# Patient Record
Sex: Female | Born: 1975 | Race: Black or African American | Hispanic: No | Marital: Married | Smoking: Never smoker
Health system: Southern US, Community
[De-identification: ages and names within clinical notes are randomized; demographics above are authoritative.]

---

## 2010-04-29 ENCOUNTER — Emergency Department (HOSPITAL_BASED_OUTPATIENT_CLINIC_OR_DEPARTMENT_OTHER): Admission: EM | Admit: 2010-04-29 | Discharge: 2010-04-29 | Payer: Self-pay | Admitting: Emergency Medicine

## 2014-02-01 ENCOUNTER — Encounter (HOSPITAL_BASED_OUTPATIENT_CLINIC_OR_DEPARTMENT_OTHER): Payer: Self-pay | Admitting: Emergency Medicine

## 2014-02-01 ENCOUNTER — Emergency Department (HOSPITAL_BASED_OUTPATIENT_CLINIC_OR_DEPARTMENT_OTHER)
Admission: EM | Admit: 2014-02-01 | Discharge: 2014-02-01 | Disposition: A | Discharge status: Home / Self Care | Payer: Self-pay | Attending: Emergency Medicine | Admitting: Emergency Medicine

## 2014-02-01 ENCOUNTER — Emergency Department (HOSPITAL_BASED_OUTPATIENT_CLINIC_OR_DEPARTMENT_OTHER): Payer: Self-pay

## 2014-02-01 DIAGNOSIS — R209 Unspecified disturbances of skin sensation: Secondary | ICD-10-CM | POA: Insufficient documentation

## 2014-02-01 DIAGNOSIS — M436 Torticollis: Secondary | ICD-10-CM | POA: Insufficient documentation

## 2014-02-01 MED ORDER — METAXALONE 800 MG PO TABS: 800.0000 mg | ORAL_TABLET | Freq: Three times a day (TID) | ORAL | Status: DC

## 2014-02-01 MED ORDER — NAPROXEN 500 MG PO TABS: 500.0000 mg | ORAL_TABLET | Freq: Two times a day (BID) | ORAL | Status: DC

## 2014-02-01 MED ORDER — HYDROCODONE-ACETAMINOPHEN 5-325 MG PO TABS: 1.0000 | ORAL_TABLET | ORAL | Status: DC | PRN

## 2014-02-01 NOTE — ED Notes (Addendum)
C/o right side neck "spasms" x 1 week

## 2014-02-01 NOTE — Discharge Instructions (Signed)
Torticollis, Acute °You have suddenly (acutely) developed a twisted neck (torticollis). This is usually a self-limited condition. °CAUSES  °Acute torticollis may be caused by malposition, trauma or infection. Most commonly, acute torticollis is caused by sleeping in an awkward position. Torticollis may also be caused by the flexion, extension or twisting of the neck muscles beyond their normal position. Sometimes, the exact cause may not be known. °SYMPTOMS  °Usually, there is pain and limited movement of the neck. Your neck may twist to one side. °DIAGNOSIS  °The diagnosis is often made by physical examination. X-rays, CT scans or MRIs may be done if there is a history of trauma or concern of infection. °TREATMENT  °For a common, stiff neck that develops during sleep, treatment is focused on relaxing the contracted neck muscle. Medications (including shots) may be used to treat the problem. Most cases resolve in several days. Torticollis usually responds to conservative physical therapy. If left untreated, the shortened and spastic neck muscle can cause deformities in the face and neck. Rarely, surgery is required. °HOME CARE INSTRUCTIONS  °· Use over-the-counter and prescription medications as directed by your caregiver. °· Do stretching exercises and massage the neck as directed by your caregiver. °· Follow up with physical therapy if needed and as directed by your caregiver. °SEEK IMMEDIATE MEDICAL CARE IF:  °· You develop difficulty breathing or noisy breathing (stridor). °· You drool, develop trouble swallowing or have pain with swallowing. °· You develop numbness or weakness in the hands or feet. °· You have changes in speech or vision. °· You have problems with urination or bowel movements. °· You have difficulty walking. °· You have a fever. °· You have increased pain. °MAKE SURE YOU:  °· Understand these instructions. °· Will watch your condition. °· Will get help right away if you are not doing well or  get worse. °Document Released: 09/12/2000 Document Revised: 12/08/2011 Document Reviewed: 10/24/2009 °[employer]® Patient Information ©2014 [employer]. ° °

## 2014-02-01 NOTE — ED Provider Notes (Signed)
CSN: [id number]     Arrival date & time 02/01/14  1628 History   First MD Initiated Contact with Patient 02/01/14 1641     Chief Complaint  Patient presents with  . Neck Pain    Patient is a 38 y.o. female presenting with neck pain.  Neck Pain Pain location:  R side Quality:  Aching Pain radiates to:  R shoulder Pain severity:  Moderate Onset quality:  Gradual Timing:  Constant Context: not fall, not MCA, not MVA and not recent injury   Relieved by:  Nothing Worsened by:  Position (movement and palpation) Ineffective treatments:  Analgesics Associated symptoms: no fever   Associated symptoms comment:  Her right arm feels numb, the entire right arm (glove pattern) Risk factors: no hx of head and neck radiation, no hx of spinal trauma and no recent head injury     History reviewed. No pertinent past medical history. History reviewed. No pertinent past surgical history. No family history on file. History  Substance Use Topics  . Smoking status: Never Smoker   . Smokeless tobacco: Not on file  . Alcohol Use: No   OB History   Grav Para Term Preterm Abortions TAB SAB Ect Mult Living                 Review of Systems  Constitutional: Negative for fever.  Musculoskeletal: Positive for neck pain.  All other systems reviewed and are negative.     Allergies  Review of patient's allergies indicates no known allergies.  Home Medications   Prior to Admission medications   Not on File   BP 132/81  Pulse 106  Temp(Src) 99 F (37.2 C) (Oral)  Resp 20  Ht 5\' 3"  (1.6 m)  Wt 185 lb (83.915 kg)  BMI 32.78 kg/m2  SpO2 100%  LMP 01/12/2014 Physical Exam  Nursing note and vitals reviewed. Constitutional: She appears well-developed and well-nourished. No distress.  HENT:  Head: Normocephalic and atraumatic.  Right Ear: External ear normal.  Left Ear: External ear normal.  Eyes: Conjunctivae are normal. Right eye exhibits no discharge. Left eye exhibits no discharge. No  scleral icterus.  Neck: Neck supple. Muscular tenderness present. Decreased range of motion present. No tracheal deviation present. No mass and no thyromegaly present.    Cardiovascular: Normal rate.   Pulmonary/Chest: Effort normal. No stridor. No respiratory distress.  Musculoskeletal: She exhibits no edema.  Neurological: She is alert. Cranial nerve deficit: no gross deficits.  Equal grip strength, sensation intact but subjective altered sensation right side.  Skin: Skin is warm and dry. No rash noted.  Psychiatric: She has a normal mood and affect.    ED Course  Procedures (including critical care time) Labs Review Labs Reviewed - No data to display  Imaging Review Dg Cervical Spine Complete  02/01/2014   CLINICAL DATA:  Neck pain and spasms.  EXAM: CERVICAL SPINE  4+ VIEWS  COMPARISON:  None.  FINDINGS: The cervical spine is visualized from the occiput to the cervicothoracic junction. There is straightening of the normal cervical lordosis without subluxation or fracture. Alignment is otherwise anatomic. Vertebral body and disc space heights are maintained. No degenerative changes. Prevertebral soft tissues are within normal limits. Neural foramina are patent. Dens is obscured on dedicated views. Visualized lung apices are clear.  IMPRESSION: Straightening of the normal cervical lordosis. No additional findings to explain the patient's pain.   Electronically Signed   By: Leanna BattlesMelinda  Blietz M.D.   On: 02/01/2014 17:13  MDM   Final diagnoses:  Torticollis    May have a component of nerve impingement but pt has good strength and the non dermatomal pattern argues against hnp.  Will dc home with pain meds, muscle relaxant.  Discussed importance of follow up   Celene KrasJon R Peace Jost, MD 02/01/14 1815

## 2015-02-27 ENCOUNTER — Telehealth: Payer: Self-pay | Admitting: Family

## 2015-02-27 ENCOUNTER — Ambulatory Visit (INDEPENDENT_AMBULATORY_CARE_PROVIDER_SITE_OTHER)
Admission: RE | Admit: 2015-02-27 | Discharge: 2015-02-27 | Disposition: A | Discharge status: Home / Self Care | Payer: No Typology Code available for payment source | Source: Ambulatory Visit | Attending: Family | Admitting: Family

## 2015-02-27 ENCOUNTER — Encounter: Payer: Self-pay | Admitting: Family

## 2015-02-27 ENCOUNTER — Ambulatory Visit (INDEPENDENT_AMBULATORY_CARE_PROVIDER_SITE_OTHER): Payer: BLUE CROSS/BLUE SHIELD | Admitting: Family

## 2015-02-27 VITALS — BP 104/72 | HR 89 | Temp 98.3°F | Resp 18 | Ht 65.0 in | Wt 198.0 lb

## 2015-02-27 DIAGNOSIS — R0789 Other chest pain: Secondary | ICD-10-CM | POA: Diagnosis not present

## 2015-02-27 MED ORDER — NAPROXEN 500 MG PO TABS: 500.0000 mg | ORAL_TABLET | Freq: Two times a day (BID) | ORAL | Status: AC

## 2015-02-27 MED ORDER — PREDNISONE 5 MG (21) PO TBPK: ORAL_TABLET | ORAL | Status: AC

## 2015-02-27 MED ORDER — NAPROXEN 500 MG PO TABS: 500.0000 mg | ORAL_TABLET | Freq: Two times a day (BID) | ORAL | Status: DC

## 2015-02-27 NOTE — Progress Notes (Signed)
Subjective:    Patient ID: Teresa Proctor, female    DOB: 07/18/1976, 39 y.o.   MRN: [id number]  Chief Complaint  Patient presents with  . Establish Care    has been having a pain in her chest, feels like she strained a muscle, hurts to the touch, every way she lays it hurts, x2 months off and on went to ER in the past with the same feeling and they said it was a pulled muscle    HPI:  Teresa Proctor is a 39 y.o. female with a PMH of no significant history who presents today for an office visit to establish care.   1.) Chest pain - Associated symptom of pain located in her chest that is described as a pulled muscle. Indicates that the pain comes and goes. Pain is described as sharp and severity of 8/10 with timing of the pain being worse at night. Modifying factors include ibuprofen that does provide some relief. Notes that there is occasional soreness when she breathes. Indicates the pain has stayed the same over the course of the 2 months. About a year ago she was seen in the ED and the ECG and x-ray performed at that time came back normal. Denies any chest pain with physical activity. Denies shortness of breath, radiating pain, back pain or heart palpitations.   No Known Allergies   Outpatient Prescriptions Prior to Visit  Medication Sig Dispense Refill  . HYDROcodone-acetaminophen (NORCO/VICODIN) 5-325 MG per tablet Take 1-2 tablets by mouth every 4 (four) hours as needed. 20 tablet 0  . metaxalone (SKELAXIN) 800 MG tablet Take 1 tablet (800 mg total) by mouth 3 (three) times daily. 21 tablet 0  . naproxen (NAPROSYN) 500 MG tablet Take 1 tablet (500 mg total) by mouth 2 (two) times daily. 30 tablet 0   No facility-administered medications prior to visit.     History reviewed. No pertinent past medical history.   History reviewed. No pertinent past surgical history.   Family History  Problem Relation Age of Onset  . Diabetes Mother   . Stroke Father      History   Social  History  . Marital Status: Married    Spouse Name: N/A  . Number of Children: 3  . Years of Education: 12   Occupational History  . CNA    Social History Main Topics  . Smoking status: Never Smoker   . Smokeless tobacco: Never Used  . Alcohol Use: Yes     Comment: occasionally  . Drug Use: No  . Sexual Activity: Not on file   Other Topics Concern  . Not on file   Social History Narrative   Fun: Shop, travel   Denies religious beliefs that would effect health care.     Review of Systems  Constitutional: Negative for fever and chills.  Respiratory: Negative for cough, chest tightness and shortness of breath.   Cardiovascular: Positive for chest pain. Negative for palpitations and leg swelling.      Objective:    BP 104/72 mmHg  Pulse 89  Temp(Src) 98.3 F (36.8 C) (Oral)  Resp 18  Ht 5\' 5"  (1.651 m)  Wt 198 lb (89.812 kg)  BMI 32.95 kg/m2  SpO2 98%  LMP 02/26/2015 Nursing note and vital signs reviewed.  Physical Exam  Constitutional: She is oriented to person, place, and time. She appears well-developed and well-nourished. No distress.  Cardiovascular: Normal rate, regular rhythm, normal heart sounds and intact distal pulses.   Pulmonary/Chest:  Effort normal and breath sounds normal.  Musculoskeletal:  No obvious deformity, discoloration, or edema of the chest noted. Tenderness elicited over the manubrium. Shoulder and chest range of motion is intact and appropriate. Muscle strength is normal.  Neurological: She is alert and oriented to person, place, and time.  Skin: Skin is warm and dry.  Psychiatric: She has a normal mood and affect. Her behavior is normal. Judgment and thought content normal.       Assessment & Plan:   Problem List Items Addressed This Visit      Other   Atypical chest pain - Primary    Atypical chest pain of undetermined origin. ECG shows normal sinus rhythm making cardiac origin less likely. Obtain chest x-ray to rule out underlying  fractures or deformities. Question possible muscle skeletal versus pleuritic chest pain given exam. Start naproxen. Follow-up pending x-ray results.      Relevant Medications   naproxen (NAPROSYN) 500 MG tablet   Other Relevant Orders   DG Chest 2 View   EKG 12-Lead (Completed)

## 2015-02-27 NOTE — Telephone Encounter (Signed)
Please inform patient that her chest x-ray is normal with no evidence of fracture or abnormality. Therefore I'd like her to try a steroid taper to see if that helps to relieve her pain. The prescription has been sent to her pharmacy with the following directions.  6 Day Prednisone Taper Instructions:   Day 1: Two tablets before breakfast, one after lunch, one after dinner, and two at bedtime.  Day 2: One tablet before breakfast, one after lunch, one after dinner, and two at bedtime Day 3: One tablet before breakfast, one after lunch, one after dinner, and one at bedtime Day 4: One tablet before breakfast, one after lunch, and one at bedtime Day 5: One tablet before breakfast and one at bedtime Day 6: One tablet before breakfast

## 2015-02-27 NOTE — Progress Notes (Signed)
Pre visit review using our clinic review tool, if applicable. No additional management support is needed unless otherwise documented below in the visit note. 

## 2015-02-27 NOTE — Assessment & Plan Note (Addendum)
Atypical chest pain of undetermined origin. ECG shows normal sinus rhythm making cardiac origin less likely. Obtain chest x-ray to rule out underlying fractures or deformities. Question possible muscle skeletal versus pleuritic chest pain given exam. Start naproxen. Follow-up pending x-ray results.

## 2015-02-27 NOTE — Patient Instructions (Signed)
Thank you for choosing [facility].  Summary/Instructions:  If your symptoms worsen or fail to improve, please contact our office for further instruction, or in case of emergency go directly to the emergency room at the closest medical facility.    Chest Pain (Nonspecific) It is often hard to give a specific diagnosis for the cause of chest pain. There is always a chance that your pain could be related to something serious, such as a heart attack or a blood clot in the lungs. You need to follow up with your health care provider for further evaluation. CAUSES   Heartburn.  Pneumonia or bronchitis.  Anxiety or stress.  Inflammation around your heart (pericarditis) or lung (pleuritis or pleurisy).  A blood clot in the lung.  A collapsed lung (pneumothorax). It can develop suddenly on its own (spontaneous pneumothorax) or from trauma to the chest.  Shingles infection (herpes zoster virus). The chest wall is composed of bones, muscles, and cartilage. Any of these can be the source of the pain.  The bones can be bruised by injury.  The muscles or cartilage can be strained by coughing or overwork.  The cartilage can be affected by inflammation and become sore (costochondritis). DIAGNOSIS  Lab tests or other studies may be needed to find the cause of your pain. Your health care provider may have you take a test called an ambulatory electrocardiogram (ECG). An ECG records your heartbeat patterns over a 24-hour period. You may also have other tests, such as:  Transthoracic echocardiogram (TTE). During echocardiography, sound waves are used to evaluate how blood flows through your heart.  Transesophageal echocardiogram (TEE).  Cardiac monitoring. This allows your health care provider to monitor your heart rate and rhythm in real time.  Holter monitor. This is a portable device that records your heartbeat and can help diagnose heart arrhythmias. It allows your health care provider  to track your heart activity for several days, if needed.  Stress tests by exercise or by giving medicine that makes the heart beat faster. TREATMENT   Treatment depends on what may be causing your chest pain. Treatment may include:  Acid blockers for heartburn.  Anti-inflammatory medicine.  Pain medicine for inflammatory conditions.  Antibiotics if an infection is present.  You may be advised to change lifestyle habits. This includes stopping smoking and avoiding alcohol, caffeine, and chocolate.  You may be advised to keep your head raised (elevated) when sleeping. This reduces the chance of acid going backward from your stomach into your esophagus. Most of the time, nonspecific chest pain will improve within 2-3 days with rest and mild pain medicine.  HOME CARE INSTRUCTIONS   If antibiotics were prescribed, take them as directed. Finish them even if you start to feel better.  For the next few days, avoid physical activities that bring on chest pain. Continue physical activities as directed.  Do not use any tobacco products, including cigarettes, chewing tobacco, or electronic cigarettes.  Avoid drinking alcohol.  Only take medicine as directed by your health care provider.  Follow your health care provider's suggestions for further testing if your chest pain does not go away.  Keep any follow-up appointments you made. If you do not go to an appointment, you could develop lasting (chronic) problems with pain. If there is any problem keeping an appointment, call to reschedule. SEEK MEDICAL CARE IF:   Your chest pain does not go away, even after treatment.  You have a rash with blisters on your chest.  You have a fever. SEEK IMMEDIATE MEDICAL CARE IF:   You have increased chest pain or pain that spreads to your arm, neck, jaw, back, or abdomen.  You have shortness of breath.  You have an increasing cough, or you cough up blood.  You have severe back or abdominal  pain.  You feel nauseous or vomit.  You have severe weakness.  You faint.  You have chills. This is an emergency. Do not wait to see if the pain will go away. Get medical help at once. Call your local emergency services (911 in [place] Do not drive yourself to the hospital. MAKE SURE YOU:   Understand these instructions.  Will watch your condition.  Will get help right away if you are not doing well or get worse. Document Released: 06/25/2005 Document Revised: 09/20/2013 Document Reviewed: 04/20/2008 [facility] Patient Information 2015 [place], [place]. This information is not intended to replace advice given to you by your health care provider. Make sure you discuss any questions you have with your health care provider.

## 2015-02-28 NOTE — Telephone Encounter (Signed)
Left message letting pt know the statement below

## 2016-02-02 ENCOUNTER — Encounter (HOSPITAL_BASED_OUTPATIENT_CLINIC_OR_DEPARTMENT_OTHER): Payer: Self-pay | Admitting: Emergency Medicine

## 2016-02-02 ENCOUNTER — Emergency Department (HOSPITAL_BASED_OUTPATIENT_CLINIC_OR_DEPARTMENT_OTHER)
Admission: EM | Admit: 2016-02-02 | Discharge: 2016-02-03 | Disposition: A | Discharge status: Home / Self Care | Payer: BLUE CROSS/BLUE SHIELD | Attending: Emergency Medicine | Admitting: Emergency Medicine

## 2016-02-02 ENCOUNTER — Emergency Department (HOSPITAL_BASED_OUTPATIENT_CLINIC_OR_DEPARTMENT_OTHER): Payer: BLUE CROSS/BLUE SHIELD

## 2016-02-02 DIAGNOSIS — M25411 Effusion, right shoulder: Secondary | ICD-10-CM | POA: Diagnosis not present

## 2016-02-02 DIAGNOSIS — M79645 Pain in left finger(s): Secondary | ICD-10-CM | POA: Diagnosis not present

## 2016-02-02 DIAGNOSIS — M25511 Pain in right shoulder: Secondary | ICD-10-CM | POA: Diagnosis present

## 2016-02-02 MED ORDER — METHOCARBAMOL 500 MG PO TABS
1000.0000 mg | ORAL_TABLET | Freq: Once | ORAL | Status: AC
  Administered 2016-02-02: 1000 mg via ORAL
  Filled 2016-02-02: qty 2

## 2016-02-02 MED ORDER — NAPROXEN 250 MG PO TABS
500.0000 mg | ORAL_TABLET | Freq: Once | ORAL | Status: AC
  Administered 2016-02-02: 500 mg via ORAL
  Filled 2016-02-02: qty 2

## 2016-02-02 NOTE — ED Provider Notes (Signed)
CSN: [id number]     Arrival date & time 02/02/16  2325 History  By signing my name below, I, [facility], attest that this documentation has been prepared under the direction and in the presence of Sheldon Amara, MD. Electronically Signed: Randell Patient, ED Scribe. 02/03/2016. 12:44 AM.   Chief Complaint  Patient presents with  . Finger Injury  . Shoulder Pain   Patient is a 40 y.o. female presenting with shoulder pain. The history is provided by the patient. No language interpreter was used.  Shoulder Pain Location:  Shoulder Time since incident:  5 days Injury: no   Shoulder location:  R shoulder Pain details:    Quality:  Aching   Radiates to:  Does not radiate   Severity:  Moderate   Onset quality:  Gradual   Duration:  5 days   Timing:  Constant   Progression:  Unchanged Chronicity:  New Handedness:  Right-handed Dislocation: no   Foreign body present:  No foreign bodies Prior injury to area:  No Relieved by:  Nothing Worsened by:  Nothing tried Associated symptoms: no back pain, no decreased range of motion, no fatigue, no fever, no muscle weakness, no neck pain, no numbness, no stiffness, no swelling and no tingling   Risk factors: no concern for non-accidental trauma    HPI Comments: Evanne Matsunaga is a 40 y.o. female who presents to the Emergency Department complaining of constant, mild right shoulder pain and left thumb pain onset 5 days ago and 1 week ago respectively. 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 She reports that she has pain at the base of her left thumb and that it pops in and out of place when she moves it. Pain is worse with movement of the affected area. Denies recent heavy lifting. Denies any other symptoms currently.  History reviewed. No pertinent past medical history. History reviewed. No pertinent past surgical history. Family History  Problem Relation Age of Onset  . Diabetes Mother   . Stroke  Father    Social History  Substance Use Topics  . Smoking status: Never Smoker   . Smokeless tobacco: Never Used  . Alcohol Use: Yes     Comment: occasionally   OB History    No data available     Review of Systems  Constitutional: Negative for fever and fatigue.  Respiratory: Negative for cough, chest tightness and shortness of breath.   Cardiovascular: Negative for chest pain, palpitations and leg swelling.  Musculoskeletal: Positive for myalgias and arthralgias. Negative for back pain, stiffness and neck pain.  Neurological: Negative for weakness and numbness.  All other systems reviewed and are negative.  Allergies  Review of patient's allergies indicates no known allergies.  Home Medications   Prior to Admission medications   Medication Sig Start Date End Date Taking? Authorizing Provider  naproxen (NAPROSYN) 500 MG tablet Take 1 tablet (500 mg total) by mouth 2 (two) times daily. 02/27/15   Veryl Speak, FNP  predniSONE (STERAPRED UNI-PAK 21 TAB) 5 MG (21) TBPK tablet Take 6 tablets x 1 day, 5 tablets x 1 day, 4 tablets x 1 day, 3 tablets x 1 day, 2 tablets x 1 day, 1 tablet x 1 day 02/27/15   Veryl Speak, FNP   BP 110/83 mmHg  Pulse 87  Temp(Src) 98.1 F (36.7 C) (Oral)  Resp 18  Ht 5\' 3"  (1.6 m)  Wt 188 lb (85.276 kg)  BMI 33.31 kg/m2  SpO2 100%  LMP 02/02/2016 Physical Exam  Constitutional: She is oriented to person, place, and time. She appears well-developed and well-nourished. No distress.  HENT:  Head: Normocephalic and atraumatic. Head is without raccoon's eyes and without Battle's sign.  Mouth/Throat: Oropharynx is clear and moist and mucous membranes are normal. No oropharyngeal exudate.  Eyes: Conjunctivae and EOM are normal. Pupils are equal, round, and reactive to light.  Neck: Trachea normal and normal range of motion. Neck supple. Carotid bruit is not present.  Cardiovascular: Normal rate and regular rhythm.  Exam reveals no gallop and no  friction rub.   No murmur heard. Pulmonary/Chest: Effort normal and breath sounds normal. She has no wheezes. She has no rales.  Lungs CTA.  Abdominal: Soft. Bowel sounds are normal. There is no tenderness. There is no rebound and no guarding.  Musculoskeletal: Normal range of motion. She exhibits tenderness.       Right shoulder: Normal. She exhibits normal range of motion, no bony tenderness, no swelling, no effusion, no crepitus, no deformity, no laceration, no pain, no spasm, normal pulse and normal strength.       Left wrist: Normal.       Cervical back: Normal.       Thoracic back: Normal.       Right upper arm: She exhibits no bony tenderness, no swelling, no edema, no deformity and no laceration.       Left hand: Normal. She exhibits normal capillary refill. Normal sensation noted. Normal strength noted.  No crepitus or step-offs of her C- and T-spine. Negative Neer's test. 5/5 strength in BUE. No step-offs or crepitus of scapula. Tenderness of the right anterior deltoid. Right bicep and tricep non-tender.  Lymphadenopathy:    She has no cervical adenopathy.  Neurological: She is alert and oriented to person, place, and time. She has normal reflexes.  DTRs intact.  Skin: Skin is warm and dry. She is not diaphoretic.  Psychiatric: She has a normal mood and affect. Her behavior is normal.    ED Course  Procedures   DIAGNOSTIC STUDIES: Oxygen Saturation is 100% on RA, normal by my interpretation.    COORDINATION OF CARE: 11:37 PM Will order naproxen, Robaxin, and x-rays of right shoulder and left hand. Will consult with hand specialist. Will provide pt with referral to follow-up with hand specialist and advised pt to follow-up with this specialist. Discussed treatment plan with pt at bedside and pt agreed to plan.  12:42 AM Returned to discuss right shoulder and left hand imaging results with pt. Will discharge home with Naprosyn and Robaxin.  Imaging Review Dg Shoulder  Right  02/03/2016  CLINICAL DATA:  Right shoulder pain for 4 days. No recent injury. Limited range of motion. EXAM: RIGHT SHOULDER - 2+ VIEW COMPARISON:  None. FINDINGS: No evidence of acute fracture or dislocation in the right shoulder. No focal bone lesion or bone destruction. Bone cortex appears intact. Acromioclavicular and coracoclavicular spaces are maintained. Increased subacromial space suggests effusion. IMPRESSION: No acute bony abnormalities demonstrated. Increased subacromial space suggests an effusion. Electronically Signed   By: Burman Nieves M.D.   On: 02/03/2016 00:35   Dg Hand Complete Left  02/03/2016  CLINICAL DATA:  Right shoulder pain and left thumb pain for 4 days. No recent injury. The thumb pops in and out of place when she moves it. EXAM: LEFT HAND - COMPLETE 3+ VIEW COMPARISON:  None. FINDINGS: There is no evidence of fracture or dislocation.  There is no evidence of arthropathy or other focal bone abnormality. Soft tissues are unremarkable. IMPRESSION: Negative. Electronically Signed   By: Burman NievesWilliam  Stevens M.D.   On: 02/03/2016 00:26   I have personally reviewed and evaluated these images as part of my medical decision-making.   MDM   Final diagnoses:  None   Filed Vitals:   02/02/16 2332  BP: 110/83  Pulse: 87  Temp: 98.1 F (36.7 C)  Resp: 18   No results found for this or any previous visit. Dg Shoulder Right  02/03/2016  CLINICAL DATA:  Right shoulder pain for 4 days. No recent injury. Limited range of motion. EXAM: RIGHT SHOULDER - 2+ VIEW COMPARISON:  None. FINDINGS: No evidence of acute fracture or dislocation in the right shoulder. No focal bone lesion or bone destruction. Bone cortex appears intact. Acromioclavicular and coracoclavicular spaces are maintained. Increased subacromial space suggests effusion. IMPRESSION: No acute bony abnormalities demonstrated. Increased subacromial space suggests an effusion. Electronically Signed   By: Burman NievesWilliam  Stevens M.D.    On: 02/03/2016 00:35   Dg Hand Complete Left  02/03/2016  CLINICAL DATA:  Right shoulder pain and left thumb pain for 4 days. No recent injury. The thumb pops in and out of place when she moves it. EXAM: LEFT HAND - COMPLETE 3+ VIEW COMPARISON:  None. FINDINGS: There is no evidence of fracture or dislocation. There is no evidence of arthropathy or other focal bone abnormality. Soft tissues are unremarkable. IMPRESSION: Negative. Electronically Signed   By: Burman NievesWilliam  Stevens M.D.   On: 02/03/2016 00:26    Medications  naproxen (NAPROSYN) tablet 500 mg (500 mg Oral Given 02/02/16 2348)  methocarbamol (ROBAXIN) tablet 1,000 mg (1,000 mg Oral Given 02/02/16 2351)   Ice, gentle stretching, NSAIDs no heavy lifting and follow up with orthopedics.  Patient verbalizes understanding and agrees to follow up I personally performed the services described in this documentation, which was scribed in my presence. The recorded information has been reviewed and is accurate.      Cy BlamerApril Maeven Mcdougall, MD 02/03/16 [phone]

## 2016-02-02 NOTE — ED Notes (Signed)
Patient states that she thinks she hurt her shoulder and thumb at work. Does not recall any injury but reports that she has had pain to her right should x 1 week, and pain to her left thumb for a little less.

## 2016-02-02 NOTE — ED Notes (Signed)
[facility] by [facility], prior to RN assessment, see MD notes, pending orders.

## 2016-02-03 ENCOUNTER — Encounter (HOSPITAL_BASED_OUTPATIENT_CLINIC_OR_DEPARTMENT_OTHER): Payer: Self-pay | Admitting: Emergency Medicine

## 2016-02-03 MED ORDER — NAPROXEN 500 MG PO TABS: 500.0000 mg | ORAL_TABLET | Freq: Two times a day (BID) | ORAL | Status: AC

## 2016-02-03 MED ORDER — METHOCARBAMOL 500 MG PO TABS
500.0000 mg | ORAL_TABLET | Freq: Two times a day (BID) | ORAL | Status: AC
Start: 2016-02-03 — End: ?

## 2016-02-03 NOTE — ED Notes (Signed)
Dr. Palumbo in to see pt, at [facility].  

## 2016-02-03 NOTE — Discharge Instructions (Signed)
Heat Therapy  Heat therapy can help ease sore, stiff, injured, and tight muscles and joints. Heat relaxes your muscles, which may help ease your pain.   RISKS AND COMPLICATIONS  If you have any of the following conditions, do not use heat therapy unless your health care provider has approved:   Poor circulation.   Healing wounds or scarred skin in the area being treated.   Diabetes, heart disease, or high blood pressure.   Not being able to feel (numbness) the area being treated.   Unusual swelling of the area being treated.   Active infections.   Blood clots.   Cancer.   Inability to communicate pain. This may include young children and people who have problems with their brain function (dementia).   Pregnancy.  Heat therapy should only be used on old, pre-existing, or long-lasting (chronic) injuries. Do not use heat therapy on new injuries unless directed by your health care provider.  HOW TO USE HEAT THERAPY  There are several different kinds of heat therapy, including:   Moist heat pack.   Warm water bath.   Hot water bottle.   Electric heating pad.   Heated gel pack.   Heated wrap.   Electric heating pad.  Use the heat therapy method suggested by your health care provider. Follow your health care provider's instructions on when and how to use heat therapy.  GENERAL HEAT THERAPY RECOMMENDATIONS   Do not sleep while using heat therapy. Only use heat therapy while you are awake.   Your skin may turn pink while using heat therapy. Do not use heat therapy if your skin turns red.   Do not use heat therapy if you have new pain.   High heat or long exposure to heat can cause burns. Be careful when using heat therapy to avoid burning your skin.   Do not use heat therapy on areas of your skin that are already irritated, such as with a rash or sunburn.  SEEK MEDICAL CARE IF:   You have blisters, redness, swelling, or numbness.   You have new pain.   Your pain is worse.  MAKE SURE  YOU:   Understand these instructions.   Will watch your condition.   Will get help right away if you are not doing well or get worse.     This information is not intended to replace advice given to you by your health care provider. Make sure you discuss any questions you have with your health care provider.     Document Released: 12/08/2011 Document Revised: 10/06/2014 Document Reviewed: 11/08/2013  Elsevier Interactive Patient Education 2016 [employer]

## 2016-02-03 NOTE — ED Notes (Signed)
C/o pain and spasms, (denies: fever,  numbness/ tingling, nv, dizziness, swelling or other sx), pt to xray, steady gait.

## 2019-04-16 ENCOUNTER — Encounter (HOSPITAL_BASED_OUTPATIENT_CLINIC_OR_DEPARTMENT_OTHER): Payer: Self-pay | Admitting: Emergency Medicine

## 2019-04-16 ENCOUNTER — Emergency Department (HOSPITAL_BASED_OUTPATIENT_CLINIC_OR_DEPARTMENT_OTHER): Payer: No Typology Code available for payment source

## 2019-04-16 ENCOUNTER — Other Ambulatory Visit: Payer: Self-pay

## 2019-04-16 ENCOUNTER — Emergency Department (HOSPITAL_BASED_OUTPATIENT_CLINIC_OR_DEPARTMENT_OTHER)
Admission: EM | Admit: 2019-04-16 | Discharge: 2019-04-16 | Disposition: A | Discharge status: Home / Self Care | Payer: No Typology Code available for payment source | Attending: Emergency Medicine | Admitting: Emergency Medicine

## 2019-04-16 DIAGNOSIS — M542 Cervicalgia: Secondary | ICD-10-CM

## 2019-04-16 DIAGNOSIS — R202 Paresthesia of skin: Secondary | ICD-10-CM

## 2019-04-16 MED ORDER — NAPROXEN 500 MG PO TABS: 500.0000 mg | ORAL_TABLET | Freq: Two times a day (BID) | ORAL | 0 refills | Status: AC

## 2019-04-16 MED ORDER — CYCLOBENZAPRINE HCL 10 MG PO TABS: 10.0000 mg | ORAL_TABLET | Freq: Two times a day (BID) | ORAL | 0 refills | Status: AC | PRN

## 2019-04-16 NOTE — ED Notes (Signed)
ED Provider at bedside. 
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
## 2022-07-25 ENCOUNTER — Other Ambulatory Visit: Payer: Self-pay

## 2022-07-25 ENCOUNTER — Encounter (HOSPITAL_BASED_OUTPATIENT_CLINIC_OR_DEPARTMENT_OTHER): Payer: Self-pay | Admitting: *Deleted

## 2022-07-25 ENCOUNTER — Emergency Department (HOSPITAL_BASED_OUTPATIENT_CLINIC_OR_DEPARTMENT_OTHER)
Admission: EM | Admit: 2022-07-25 | Discharge: 2022-07-25 | Disposition: A | Discharge status: Home / Self Care | Payer: Self-pay | Attending: Emergency Medicine | Admitting: Emergency Medicine

## 2022-07-25 ENCOUNTER — Emergency Department (HOSPITAL_BASED_OUTPATIENT_CLINIC_OR_DEPARTMENT_OTHER): Payer: Self-pay

## 2022-07-25 DIAGNOSIS — M5412 Radiculopathy, cervical region: Secondary | ICD-10-CM | POA: Insufficient documentation

## 2022-07-25 MED ORDER — METHYLPREDNISOLONE 4 MG PO TBPK: ORAL_TABLET | ORAL | 0 refills | Status: AC

## 2022-07-25 MED ORDER — LIDOCAINE 5 % EX PTCH: 1.0000 | MEDICATED_PATCH | CUTANEOUS | 0 refills | Status: AC

## 2022-07-25 MED ORDER — IBUPROFEN 400 MG PO TABS
400.0000 mg | ORAL_TABLET | Freq: Once | ORAL | Status: AC | PRN
  Administered 2022-07-25: 400 mg via ORAL
  Filled 2022-07-25: qty 1

## 2022-07-25 MED ORDER — ACETAMINOPHEN 500 MG PO TABS
1000.0000 mg | ORAL_TABLET | Freq: Once | ORAL | Status: AC
  Administered 2022-07-25: 1000 mg via ORAL
  Filled 2022-07-25: qty 2

## 2022-07-25 MED ORDER — CYCLOBENZAPRINE HCL 10 MG PO TABS: 10.0000 mg | ORAL_TABLET | Freq: Two times a day (BID) | ORAL | 0 refills | Status: AC | PRN

## 2022-07-25 MED ORDER — LIDOCAINE 5 % EX PTCH
1.0000 | MEDICATED_PATCH | Freq: Once | CUTANEOUS | Status: DC
  Administered 2022-07-25: 1 via TRANSDERMAL
  Filled 2022-07-25: qty 3

## 2022-07-25 NOTE — Discharge Instructions (Signed)
You were seen in the emergency department with your neck pain.  You have cervical radiculopathy which is a nerve type of pain coming from the nerves coming out of your neck.  You should continue to take the naproxen daily as well as Tylenol to help with the inflammation around the nerve and the pain.  I have given you a steroid taper that should give additional help with the inflammation.  You can use lidocaine patches, ice or heat.  I have also given you a refill of your muscle relaxer.  This can make you drowsy so do not take it before driving, working, operating heavy machinery or watching small children when you are alone.  You should follow-up with your primary doctor in the next few days to have your symptoms rechecked and you may benefit from restarting physical therapy.  I have also given you our spine doctors here if you would like to schedule follow-up.  You should return to the emergency department if you are having significantly worsening numbness or weakness of this arm, you have fevers or if you have any other new or concerning symptoms.

## 2022-07-25 NOTE — ED Provider Notes (Signed)
[facility] EMERGENCY DEPARTMENT Provider Note   CSN: [id number] Arrival date & time: 07/25/22  1357     History  Chief Complaint  Patient presents with   Neck Pain        Back Pain    Teresa Proctor is a 46 y.o. female.  Patient is a 46 year old female with a past medical history of chronic neck and back pain after an accident several years ago presenting to the emergency department for acute worsening of her neck pain.  She states that she was previously seen by a spine doctor and was in physical therapy but lost her job 6 months ago and her new insurance was not covered by her spine doctor.  She states that her symptoms were improving at that time though and that she was able to go back to work.  She states over the last 2 weeks she has had increasing pain on the left side of her neck with tingling in her first and second digits.  She denies any weakness.  Nuys any new trauma or falls.  She states that she has been taking naproxen for pain and Flexeril with only mild improvement.  The history is provided by the patient.  Neck Pain Back Pain      Home Medications Prior to Admission medications   Medication Sig Start Date End Date Taking? Authorizing Provider  cyclobenzaprine (FLEXERIL) 10 MG tablet Take 1 tablet (10 mg total) by mouth 2 (two) times daily as needed for muscle spasms. 07/25/22  Yes Kingsley, Turkey K, DO  lidocaine (LIDODERM) 5 % Place 1 patch onto the skin daily. Remove & Discard patch within 12 hours or as directed by MD 07/25/22  Yes Theresia Lo, Benetta Spar K, DO  methylPREDNISolone (MEDROL DOSEPAK) 4 MG TBPK tablet Day 1: 8 mg before breakfast, 4 mg after lunch, 4 mg after supper, and 8 mg at bedtime Day 2: 4 mg before breakfast, 4 mg after lunch, 4 mg after supper, and 8 mg at bedtime. Day 3: 4 mg before breakfast, 4 mg after lunch, 4 mg after supper, and 4 mg at bedtime. Day 4: 4 mg before breakfast, 4 mg after lunch, and 4 mg at bedtime. Day 5: 4 mg  before breakfast and 4 mg at bedtime. Day 6: 4 mg before breakfast. 07/25/22  Yes Theresia Lo, Turkey K, DO  cyclobenzaprine (FLEXERIL) 10 MG tablet Take 1 tablet (10 mg total) by mouth 2 (two) times daily as needed for muscle spasms. 04/16/19   Sabas Sous, MD  methocarbamol (ROBAXIN) 500 MG tablet Take 1 tablet (500 mg total) by mouth 2 (two) times daily. 02/03/16   Palumbo, April, MD  naproxen (NAPROSYN) 500 MG tablet Take 1 tablet (500 mg total) by mouth 2 (two) times daily. 02/27/15   Veryl Speak, FNP  naproxen (NAPROSYN) 500 MG tablet Take 1 tablet (500 mg total) by mouth 2 (two) times daily. 02/03/16   Palumbo, April, MD  naproxen (NAPROSYN) 500 MG tablet Take 1 tablet (500 mg total) by mouth 2 (two) times daily. 04/16/19   Sabas Sous, MD  predniSONE (STERAPRED UNI-PAK 21 TAB) 5 MG (21) TBPK tablet Take 6 tablets x 1 day, 5 tablets x 1 day, 4 tablets x 1 day, 3 tablets x 1 day, 2 tablets x 1 day, 1 tablet x 1 day 02/27/15   Veryl Speak, FNP      Allergies    Patient has no known allergies.    Review of Systems  Review of Systems  Musculoskeletal:  Positive for back pain and neck pain.    Physical Exam Updated Vital Signs BP (!) 156/102 (BP Location: Right Arm)   Pulse 90   Temp 98.4 F (36.9 C) (Oral)   Resp 20   Ht 5\' 3"  (1.6 m)   Wt 94.8 kg   SpO2 100%   BMI 37.02 kg/m  Physical Exam Vitals and nursing note reviewed.  Constitutional:      General: She is not in acute distress.    Appearance: Normal appearance.  HENT:     Head: Normocephalic and atraumatic.     Nose: Nose normal.     Mouth/Throat:     Mouth: Mucous membranes are moist.     Pharynx: Oropharynx is clear.  Eyes:     Extraocular Movements: Extraocular movements intact.     Conjunctiva/sclera: Conjunctivae normal.  Neck:     Comments: No midline neck tenderness Left-sided cervical paraspinal muscle and left trapezius muscle tenderness to palpation with taut muscles on palpation Positive  Spurling test on the left  Cardiovascular:     Rate and Rhythm: Normal rate.     Pulses: Normal pulses.  Pulmonary:     Effort: Pulmonary effort is normal.  Abdominal:     General: Abdomen is flat.  Musculoskeletal:        General: Normal range of motion.     Cervical back: Normal range of motion and neck supple.     Comments: No midline back tenderness No tenderness to palpation of bilateral upper and lower extremities  Skin:    General: Skin is warm and dry.     Findings: No rash.  Neurological:     General: No focal deficit present.     Mental Status: She is alert and oriented to person, place, and time.     Sensory: No sensory deficit.     Motor: No weakness.  Psychiatric:        Mood and Affect: Mood normal.        Behavior: Behavior normal.     ED Results / Procedures / Treatments   Labs (all labs ordered are listed, but only abnormal results are displayed) Labs Reviewed - No data to display   EKG None  Radiology CT Cervical Spine Wo Contrast  Result Date: 07/25/2022 CLINICAL DATA:  Cervical radiculopathy, no red flags EXAM: CT CERVICAL SPINE WITHOUT CONTRAST TECHNIQUE: Multidetector CT imaging of the cervical spine was performed without intravenous contrast. Multiplanar CT image reconstructions were also generated. RADIATION DOSE REDUCTION: This exam was performed according to the departmental dose-optimization program which includes automated exposure control, adjustment of the mA and/or kV according to patient size and/or use of iterative reconstruction technique. COMPARISON:  CT cervical spine 04/16/2019. FINDINGS: Alignment: No substantial sagittal subluxation. Skull base and vertebrae: Vertebral body heights are maintained. No evidence of acute fracture Soft tissues and spinal canal: No prevertebral fluid or swelling. No visible canal hematoma. Disc levels: No significant focal bony degenerative change. No significant bony canal or foraminal stenosis. Upper chest:  Visualized lung apices are clear. IMPRESSION: 1. No evidence of acute fracture or malalignment. 2. No significant bony canal or foraminal stenosis. An MRI could provide better evaluation of the canal and foramina if clinically warranted. Electronically Signed   By: Margaretha Sheffield M.D.   On: 07/25/2022 16:57    Procedures Procedures    Medications Ordered in ED Medications  acetaminophen (TYLENOL) tablet 1,000 mg (has no administration in time range)  lidocaine (LIDODERM) 5 % 1-3 patch (has no administration in time range)  ibuprofen (ADVIL) tablet 400 mg (400 mg Oral Given 07/25/22 1446)    ED Course/ Medical Decision Making/ A&P                           Medical Decision Making This patient presents to the ED with chief complaint(s) of neck pain with pertinent past medical history of prior neck and back injury several years ago which further complicates the presenting complaint. The complaint involves an extensive differential diagnosis and also carries with it a high risk of complications and morbidity.    The differential diagnosis includes radiculopathy, muscle strain, muscle spasm, fracture or dislocation unlikely as no recent trauma, vascular injury, meningitis unlikely as no fevers or headache and no rigidity  Additional history obtained: Additional history obtained from N/A Records reviewed Care Everywhere/External Records  ED Course and Reassessment: Patient was initially evaluated in triage and had CT of her neck performed that showed no acute disease.  Was given Motrin for pain with mild relief and will be given additional Tylenol and lidocaine patch.  Patient positive left-sided Spurling test consistent with cervical radiculopathy.  She has no numbness or weakness in her arm.  She will be given a steroid taper due to her acute exacerbation of pain despite taking NSAIDs and will additionally be given a refill of her Flexeril and lidocaine patches.  She was recommended  follow-up with her primary doctor as she would likely benefit from resuming physical therapy and will be given spine surgery follow-up  Independent labs interpretation:  N/A  Independent visualization of imaging: - I independently visualized the following imaging with scope of interpretation limited to determining acute life threatening conditions related to emergency care: CT cervical spine, which revealed no acute traumatic injury  Consultation: - Consulted or discussed management/test interpretation w/ external professional: N/A  Consideration for admission or further workup: Patient has no emergent conditions requiring admission or further work-up at this time and is stable for discharge with outpatient primary care and spine surgery follow-up Social Determinants of health: N/A    Risk Prescription drug management.          Final Clinical Impression(s) / ED Diagnoses Final diagnoses:  Cervical radiculopathy    Rx / DC Orders ED Discharge Orders          Ordered    methylPREDNISolone (MEDROL DOSEPAK) 4 MG TBPK tablet        07/25/22 2006    cyclobenzaprine (FLEXERIL) 10 MG tablet  2 times daily PRN        07/25/22 2006    lidocaine (LIDODERM) 5 %  Every 24 hours        07/25/22 2006              Rexford Maus, DO 07/25/22 2007

## 2022-07-25 NOTE — ED Triage Notes (Addendum)
Patient states she had a fall last year from a truck that resulted in a head injury.  She has had therapy and seen chiropractor for same. Patient returned to work 6 months ago. She states she has had onset of pain in her head and spasms in her neck and back for the past 2 weeks.  She states the sx radiate into the left arm and hand.  She reports her middle and pointer finger will go numb at times.  She has not taken any pain meds today.  Patient is noted to not be able to turn her head to the right due to pain.  Patient states she has herniated disks in her neck and back.
